# Patient Record
Sex: Female | Born: 1990 | Race: White | Hispanic: No | Marital: Single | Smoking: Never smoker
Health system: Southern US, Community
[De-identification: ages and names within clinical notes are randomized; demographics above are authoritative.]

## PROBLEM LIST (undated history)

## (undated) DIAGNOSIS — F988 Other specified behavioral and emotional disorders with onset usually occurring in childhood and adolescence: Secondary | ICD-10-CM

## (undated) HISTORY — DX: Other specified behavioral and emotional disorders with onset usually occurring in childhood and adolescence: F98.8

## (undated) HISTORY — PX: ADENOIDECTOMY: SUR15

---

## 2018-04-12 ENCOUNTER — Encounter (HOSPITAL_COMMUNITY): Payer: Self-pay | Admitting: Emergency Medicine

## 2018-04-12 ENCOUNTER — Ambulatory Visit (HOSPITAL_COMMUNITY)
Admission: EM | Admit: 2018-04-12 | Discharge: 2018-04-12 | Disposition: A | Discharge status: Home / Self Care | Payer: Self-pay | Attending: Emergency Medicine | Admitting: Emergency Medicine

## 2018-04-12 ENCOUNTER — Ambulatory Visit (INDEPENDENT_AMBULATORY_CARE_PROVIDER_SITE_OTHER): Payer: Self-pay

## 2018-04-12 DIAGNOSIS — M542 Cervicalgia: Secondary | ICD-10-CM

## 2018-04-12 DIAGNOSIS — M546 Pain in thoracic spine: Secondary | ICD-10-CM

## 2018-04-12 MED ORDER — CYCLOBENZAPRINE HCL 5 MG PO TABS: 5.0000 mg | ORAL_TABLET | Freq: Every evening | ORAL | 0 refills | Status: DC | PRN

## 2018-04-12 MED ORDER — MELOXICAM 7.5 MG PO TABS: 7.5000 mg | ORAL_TABLET | Freq: Every day | ORAL | 0 refills | Status: DC
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
## 2018-04-22 ENCOUNTER — Encounter: Payer: Self-pay | Admitting: Family Medicine

## 2018-04-22 ENCOUNTER — Ambulatory Visit: Payer: Commercial Managed Care - PPO | Admitting: Family Medicine

## 2018-04-22 VITALS — BP 120/70 | HR 95 | Ht 63.25 in | Wt 121.4 lb

## 2018-04-22 DIAGNOSIS — R61 Generalized hyperhidrosis: Secondary | ICD-10-CM | POA: Diagnosis not present

## 2018-04-22 DIAGNOSIS — F419 Anxiety disorder, unspecified: Secondary | ICD-10-CM | POA: Diagnosis not present

## 2018-04-22 DIAGNOSIS — R5383 Other fatigue: Secondary | ICD-10-CM

## 2018-04-22 DIAGNOSIS — R8290 Unspecified abnormal findings in urine: Secondary | ICD-10-CM | POA: Diagnosis not present

## 2018-04-22 DIAGNOSIS — G4700 Insomnia, unspecified: Secondary | ICD-10-CM | POA: Diagnosis not present

## 2018-04-22 DIAGNOSIS — Z862 Personal history of diseases of the blood and blood-forming organs and certain disorders involving the immune mechanism: Secondary | ICD-10-CM

## 2018-04-22 DIAGNOSIS — F988 Other specified behavioral and emotional disorders with onset usually occurring in childhood and adolescence: Secondary | ICD-10-CM | POA: Insufficient documentation

## 2018-04-22 DIAGNOSIS — N3001 Acute cystitis with hematuria: Secondary | ICD-10-CM

## 2018-04-22 DIAGNOSIS — Z8349 Family history of other endocrine, nutritional and metabolic diseases: Secondary | ICD-10-CM

## 2018-04-22 LAB — POCT URINALYSIS DIP (PROADVANTAGE DEVICE)
Bilirubin, UA: NEGATIVE
Glucose, UA: NEGATIVE mg/dL
Ketones, POC UA: NEGATIVE mg/dL
Leukocytes, UA: NEGATIVE
Nitrite, UA: POSITIVE — AB
PH UA: 6 (ref 5.0–8.0)
PROTEIN UA: NEGATIVE mg/dL
SPECIFIC GRAVITY, URINE: 1.03
UUROB: NEGATIVE

## 2018-04-22 MED ORDER — SULFAMETHOXAZOLE-TRIMETHOPRIM 800-160 MG PO TABS: 1.0000 | ORAL_TABLET | Freq: Two times a day (BID) | ORAL | 0 refills | Status: DC

## 2018-04-22 MED ORDER — LISDEXAMFETAMINE DIMESYLATE 20 MG PO CAPS: 20.0000 mg | ORAL_CAPSULE | Freq: Every day | ORAL | 0 refills | Status: DC

## 2018-04-22 NOTE — Progress Notes (Signed)
Subjective:    Patient ID: Nicole Vang, female    DOB: 02/26/1991, 27 y.o.   MRN: [id number]  HPI Chief Complaint  Patient presents with  . new pt    new pt get established. discuss ADD   She is new to the practice and here to establish care. Previous medical care: moved here from [place] one year ago.   Past medical history of ADD Sates she was dagnosed last year in [place].  She remembers as far back as 8th grade that she had issues focusing and this worsened in college.  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  States ADD is affecting her job and her personal life. She is single now.  States when she is not on the medication she feels extremely anxious. Denies history of anxiety but thinks she may have a problem with anxiety as well.   States she has been feeling increasingly irritable and on edge.  New issue with insomnia and night sweats.  Reports history of hair falling out "in clumps". No bald spots per patient.   States her periods are regular. Not sexually active and no chance of pregnancy.   Brother with thyroid disease and she is concerned that her thyroid may be abnormal.   Borderline anemia in the past.   She also complains of her urine having an "odor". States this has been the case for months. Thinks she could have a UTI. History of UTI with only bad odor. No frequency, urgency or dysuria.   Single. No kids.  Denies smoking, drug use. Alcohol is not often.   Denies fever, chills, dizziness, chest pain, palpitations, shortness of breath, cough, abdominal pain, nausea, vomiting.    Reviewed allergies, medications, past medical, surgical, family, and social history.    Review of Systems Pertinent positives and negatives in the history of present illness.     Objective:   Physical Exam BP 120/70   Pulse 95   Ht 5' 3.25" (1.607 m)   Wt 121 lb 6.4 oz (55.1 kg)   LMP 04/17/2018   BMI 21.34 kg/m    Alert and in no distress. Pharyngeal area is normal. Neck is supple without adenopathy or thyromegaly. Cardiac exam shows a regular sinus rhythm without murmurs or gallops. Lungs are clear to auscultation. No CVAT. Extremities without edema, normal pulses. PERRLA, EOMs intact, CN II-IX intact, DTRs symmetric. Skin is warm and dry, no pallor.       Assessment & Plan:  Attention deficit disorder (ADD) without hyperactivity - Plan: lisdexamfetamine (VYVANSE) 20 MG capsule  Fatigue, unspecified type - Plan: CBC with Differential/Platelet, Comprehensive metabolic panel, TSH, VITAMIN D 25 Hydroxy (Vit-D Deficiency, Fractures), Iron, TIBC and Ferritin Panel  Insomnia, unspecified type - Plan: TSH  Night sweats - Plan: TSH  Anxiety - Plan: CBC with Differential/Platelet, Comprehensive metabolic panel, TSH  History of anemia - Plan: CBC with Differential/Platelet, Iron, TIBC and Ferritin Panel  Malodorous urine - Plan: POCT Urinalysis DIP (Proadvantage Device)  Family history thyroid disease in brother - Plan: TSH  Acute cystitis with hematuria - Plan: sulfamethoxazole-trimethoprim (BACTRIM DS,SEPTRA DS) 800-160 MG tablet, Urine Culture, CANCELED: Urinalysis, microscopic only  She has her last prescription bottle of Vyvanse with her from [place]. I will give her a 30 day refill and get the documentation from her previous PCP.  She would like to see a therapist to discuss possible anxiety. A list of names was  given and she will schedule with someone.  Treat for acute cystitis. UA shows +nit, blood 2+ Urine sent for culture. She should have a repeat UA in a couple of weeks to make sure hematuria resolves.  Check labs including thyroid function and follow up.

## 2018-04-22 NOTE — Patient Instructions (Signed)
You can call to schedule your appointment with the Counselor. A few offices are listed below for you to call.   [facility]- Cristy FolksLauren Lewin Barry [address] [place], [place] [address] [phone]  [facility]  [address], [place], [place] [address]  Phone: [phone]  [facility] [address] [place], [place] [address]  Phone: [phone]

## 2018-04-23 LAB — COMPREHENSIVE METABOLIC PANEL
ALK PHOS: 70 IU/L (ref 39–117)
ALT: 13 IU/L (ref 0–32)
AST: 15 IU/L (ref 0–40)
Albumin/Globulin Ratio: 1.9 (ref 1.2–2.2)
Albumin: 4.8 g/dL (ref 3.5–5.5)
BILIRUBIN TOTAL: 0.3 mg/dL (ref 0.0–1.2)
BUN/Creatinine Ratio: 15 (ref 9–23)
BUN: 11 mg/dL (ref 6–20)
CHLORIDE: 106 mmol/L (ref 96–106)
CO2: 22 mmol/L (ref 20–29)
Calcium: 9.6 mg/dL (ref 8.7–10.2)
Creatinine, Ser: 0.73 mg/dL (ref 0.57–1.00)
GFR calc non Af Amer: 112 mL/min/{1.73_m2} (ref >59)
GFR, EST AFRICAN AMERICAN: 129 mL/min/{1.73_m2} (ref >59)
GLUCOSE: 80 mg/dL (ref 65–99)
Globulin, Total: 2.5 g/dL (ref 1.5–4.5)
Potassium: 4.4 mmol/L (ref 3.5–5.2)
Sodium: 143 mmol/L (ref 134–144)
TOTAL PROTEIN: 7.3 g/dL (ref 6.0–8.5)

## 2018-04-23 LAB — IRON,TIBC AND FERRITIN PANEL
Ferritin: 31 ng/mL (ref 15–150)
Iron Saturation: 19 % (ref 15–55)
Iron: 62 ug/dL (ref 27–159)
TIBC: 332 ug/dL (ref 250–450)
UIBC: 270 ug/dL (ref 131–425)

## 2018-04-23 LAB — CBC WITH DIFFERENTIAL/PLATELET
BASOS ABS: 0 10*3/uL (ref 0.0–0.2)
Basos: 0 %
EOS (ABSOLUTE): 0.1 10*3/uL (ref 0.0–0.4)
Eos: 1 %
Hematocrit: 38.3 % (ref 34.0–46.6)
Hemoglobin: 12.8 g/dL (ref 11.1–15.9)
IMMATURE GRANS (ABS): 0 10*3/uL (ref 0.0–0.1)
Immature Granulocytes: 0 %
LYMPHS: 47 %
Lymphocytes Absolute: 2.5 10*3/uL (ref 0.7–3.1)
MCH: 32 pg (ref 26.6–33.0)
MCHC: 33.4 g/dL (ref 31.5–35.7)
MCV: 96 fL (ref 79–97)
Monocytes Absolute: 0.3 10*3/uL (ref 0.1–0.9)
Monocytes: 5 %
NEUTROS ABS: 2.5 10*3/uL (ref 1.4–7.0)
Neutrophils: 47 %
PLATELETS: 272 10*3/uL (ref 150–450)
RBC: 4 x10E6/uL (ref 3.77–5.28)
RDW: 12.7 % (ref 12.3–15.4)
WBC: 5.4 10*3/uL (ref 3.4–10.8)

## 2018-04-23 LAB — VITAMIN D 25 HYDROXY (VIT D DEFICIENCY, FRACTURES): VIT D 25 HYDROXY: 20.1 ng/mL — AB (ref 30.0–100.0)

## 2018-04-23 LAB — TSH: TSH: 1.87 u[IU]/mL (ref 0.450–4.500)

## 2018-04-24 LAB — URINE CULTURE

## 2018-05-09 ENCOUNTER — Telehealth: Payer: Self-pay | Admitting: Family Medicine

## 2018-05-09 NOTE — Telephone Encounter (Signed)
Received requested info from [facility]. Sending back for review.

## 2018-05-16 ENCOUNTER — Encounter: Payer: Self-pay | Admitting: Family Medicine

## 2019-04-19 NOTE — Progress Notes (Deleted)
   Subjective:    Patient ID: Nicole Vang, female    DOB: 07/03/1991, 28 y.o.   MRN: [id number]  HPI No chief complaint on file.  She is here for a complete physical exam. Previous medical care: Last CPE:  Other providers:  Past medical history: Surgeries:  Family history: Mental Health History:  Social history: Lives with ***, works as ***, *** Smoking, drinking alcohol, drug use  Diet: *** Excerise: ***  Immunizations:  Health maintenance:  Mammogram: Colonoscopy: Last Gynecological Exam: Last Menstrual cycle: Pregnancies:  Last Dental Exam: Last Eye Exam:  Wears seatbelt always, uses sunscreen, smoke detectors in home and functioning, does not text while driving and feels safe in home environment.   Reviewed allergies, medications, past medical, surgical, family, and social history.     Review of Systems Review of Systems Constitutional: -fever, -chills, -sweats, -unexpected weight change,-fatigue ENT: -runny nose, -ear pain, -sore throat Cardiology:  -chest pain, -palpitations, -edema Respiratory: -cough, -shortness of breath, -wheezing Gastroenterology: -abdominal pain, -nausea, -vomiting, -diarrhea, -constipation  Hematology: -bleeding or bruising problems Musculoskeletal: -arthralgias, -myalgias, -joint swelling, -back pain Ophthalmology: -vision changes Urology: -dysuria, -difficulty urinating, -hematuria, -urinary frequency, -urgency Neurology: -headache, -weakness, -tingling, -numbness       Objective:   Physical Exam There were no vitals taken for this visit.  General Appearance:    Alert, cooperative, no distress, appears stated age  Head:    Normocephalic, without obvious abnormality, atraumatic  Eyes:    PERRL, conjunctiva/corneas clear, EOM's intact, fundi    benign  Ears:    Normal TM's and external ear canals  Nose:   Nares normal, mucosa normal, no drainage or sinus   tenderness  Throat:   Lips, mucosa, and tongue normal; teeth  and gums normal  Neck:   Supple, no lymphadenopathy;  thyroid:  no   enlargement/tenderness/nodules; no carotid   bruit or JVD  Back:    Spine nontender, no curvature, ROM normal, no CVA     tenderness  Lungs:     Clear to auscultation bilaterally without wheezes, rales or     ronchi; respirations unlabored  Chest Wall:    No tenderness or deformity   Heart:    Regular rate and rhythm, S1 and S2 normal, no murmur, rub   or gallop  Breast Exam:    No tenderness, masses, or nipple discharge or inversion.      No axillary lymphadenopathy  Abdomen:     Soft, non-tender, nondistended, normoactive bowel sounds,    no masses, no hepatosplenomegaly  Genitalia:    Normal external genitalia without lesions.  BUS and vagina normal; cervix without lesions, or cervical motion tenderness. No abnormal vaginal discharge.  Uterus and adnexa not enlarged, nontender, no masses.  Pap performed  Rectal:    Not performed due to age<40 and no related complaints  Extremities:   No clubbing, cyanosis or edema  Pulses:   2+ and symmetric all extremities  Skin:   Skin color, texture, turgor normal, no rashes or lesions  Lymph nodes:   Cervical, supraclavicular, and axillary nodes normal  Neurologic:   CNII-XII intact, normal strength, sensation and gait; reflexes 2+ and symmetric throughout          Psych:   Normal mood, affect, hygiene and grooming.           Assessment & Plan:  Routine general medical examination at a health care facility

## 2019-04-20 ENCOUNTER — Encounter: Payer: Commercial Managed Care - PPO | Admitting: Family Medicine
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
## 2020-02-19 ENCOUNTER — Ambulatory Visit: Payer: 59 | Attending: Internal Medicine

## 2021-04-03 ENCOUNTER — Ambulatory Visit: Payer: BLUE CROSS/BLUE SHIELD | Admitting: Internal Medicine

## 2022-02-06 DIAGNOSIS — M25519 Pain in unspecified shoulder: Secondary | ICD-10-CM | POA: Diagnosis not present

## 2022-03-16 DIAGNOSIS — M25511 Pain in right shoulder: Secondary | ICD-10-CM | POA: Diagnosis not present

## 2022-06-24 DIAGNOSIS — M25511 Pain in right shoulder: Secondary | ICD-10-CM | POA: Diagnosis not present

## 2022-07-15 DIAGNOSIS — F4321 Adjustment disorder with depressed mood: Secondary | ICD-10-CM | POA: Diagnosis not present

## 2022-07-22 DIAGNOSIS — F4321 Adjustment disorder with depressed mood: Secondary | ICD-10-CM | POA: Diagnosis not present

## 2022-07-29 DIAGNOSIS — F4321 Adjustment disorder with depressed mood: Secondary | ICD-10-CM | POA: Diagnosis not present

## 2022-08-20 DIAGNOSIS — Z6820 Body mass index (BMI) 20.0-20.9, adult: Secondary | ICD-10-CM | POA: Diagnosis not present

## 2022-08-20 DIAGNOSIS — Z01419 Encounter for gynecological examination (general) (routine) without abnormal findings: Secondary | ICD-10-CM | POA: Diagnosis not present

## 2022-09-02 DIAGNOSIS — F4321 Adjustment disorder with depressed mood: Secondary | ICD-10-CM | POA: Diagnosis not present

## 2022-09-23 DIAGNOSIS — F4321 Adjustment disorder with depressed mood: Secondary | ICD-10-CM | POA: Diagnosis not present

## 2022-10-28 DIAGNOSIS — F4321 Adjustment disorder with depressed mood: Secondary | ICD-10-CM | POA: Diagnosis not present

## 2023-08-09 ENCOUNTER — Ambulatory Visit: Payer: BC Managed Care – PPO | Admitting: Internal Medicine

## 2023-08-24 ENCOUNTER — Encounter: Payer: Self-pay | Admitting: Internal Medicine

## 2023-08-24 ENCOUNTER — Ambulatory Visit: Payer: BC Managed Care – PPO | Admitting: Internal Medicine

## 2023-08-24 VITALS — BP 120/80 | HR 99 | Temp 99.6°F | Ht 63.0 in | Wt 125.1 lb

## 2023-08-24 DIAGNOSIS — F900 Attention-deficit hyperactivity disorder, predominantly inattentive type: Secondary | ICD-10-CM | POA: Diagnosis not present

## 2023-08-24 MED ORDER — AMPHETAMINE-DEXTROAMPHET ER 20 MG PO CP24: 20.0000 mg | ORAL_CAPSULE | Freq: Every day | ORAL | 0 refills | Status: AC

## 2023-08-24 MED ORDER — AMPHETAMINE-DEXTROAMPHETAMINE 10 MG PO TABS: 10.0000 mg | ORAL_TABLET | Freq: Every day | ORAL | 0 refills | Status: AC

## 2023-08-24 NOTE — Progress Notes (Signed)
Established Patient Office Visit     CC/Reason for Visit: Establish care, medication refills, discuss chronic concerns  HPI: Nicole Vang is a 32 y.o. female who is coming in today for the above mentioned reasons. Past Medical History is significant for: ADHD on Adderall XR 20 mg and an additional 10 mg in the evening.  She is looking to establish care.  She does not smoke, she drinks alcohol occasionally, no known drug allergies, past surgical history is only significant for adenoidectomy.  She is due for Tdap, flu, COVID vaccinations.  She has [facility] G[facility].   Past Medical/Surgical History: Past Medical History:  Diagnosis Date   ADD (attention deficit disorder)     Past Surgical History:  Procedure Laterality Date   ADENOIDECTOMY      Social History:  reports that she has never smoked. She has never used smokeless tobacco. She reports current alcohol use. She reports that she does not use drugs.  Allergies: No Known Allergies  Family History:  Family History  Problem Relation Age of Onset   Fibromyalgia Mother    Bipolar disorder Mother    Thyroid disease Brother    Asthma Brother      Current Outpatient Medications:    amphetamine-dextroamphetamine (ADDERALL XR) 20 MG 24 hr capsule, Take 1 capsule (20 mg total) by mouth daily., Disp: 30 capsule, Rfl: 0   amphetamine-dextroamphetamine (ADDERALL XR) 20 MG 24 hr capsule, Take 1 capsule (20 mg total) by mouth daily., Disp: 30 capsule, Rfl: 0   amphetamine-dextroamphetamine (ADDERALL XR) 20 MG 24 hr capsule, Take 1 capsule (20 mg total) by mouth daily., Disp: 30 capsule, Rfl: 0   amphetamine-dextroamphetamine (ADDERALL) 10 MG tablet, Take 1 tablet (10 mg total) by mouth daily., Disp: 30 tablet, Rfl: 0   amphetamine-dextroamphetamine (ADDERALL) 10 MG tablet, Take 1 tablet (10 mg total) by mouth daily., Disp: 30 tablet, Rfl: 0   amphetamine-dextroamphetamine (ADDERALL) 10 MG tablet, Take 1 tablet  (10 mg total) by mouth daily., Disp: 30 tablet, Rfl: 0  Review of Systems:  Negative unless indicated in HPI.   Physical Exam: Vitals:   08/24/23 1125  BP: 120/80  Pulse: 99  Temp: 99.6 F (37.6 C)  TempSrc: Oral  SpO2: 100%  Weight: 125 lb 1.6 oz (56.7 kg)  Height: 5\' 3"  (1.6 m)    Body mass index is 22.16 kg/m.   Physical Exam Vitals reviewed.  Constitutional:      Appearance: Normal appearance.  HENT:     Head: Normocephalic and atraumatic.  Eyes:     Conjunctiva/sclera: Conjunctivae normal.     Pupils: Pupils are equal, round, and reactive to light.  Cardiovascular:     Rate and Rhythm: Normal rate and regular rhythm.  Pulmonary:     Effort: Pulmonary effort is normal.     Breath sounds: Normal breath sounds.  Skin:    General: Skin is warm and dry.  Neurological:     General: No focal deficit present.     Mental Status: She is alert and oriented to person, place, and time.  Psychiatric:        Mood and Affect: Mood normal.        Behavior: Behavior normal.        Thought Content: Thought content normal.        Judgment: Judgment normal.      Impression and Plan:  Attention deficit hyperactivity disorder (ADHD), predominantly inattentive type -  Amphetamine-Dextroamphet ER; Take 1 capsule (20 mg total) by mouth daily.  Dispense: 30 capsule; Refill: 0 -     Amphetamine-Dextroamphet ER; Take 1 capsule (20 mg total) by mouth daily.  Dispense: 30 capsule; Refill: 0 -     Amphetamine-Dextroamphet ER; Take 1 capsule (20 mg total) by mouth daily.  Dispense: 30 capsule; Refill: 0 -     Amphetamine-Dextroamphetamine; Take 1 tablet (10 mg total) by mouth daily.  Dispense: 30 tablet; Refill: 0 -     Amphetamine-Dextroamphetamine; Take 1 tablet (10 mg total) by mouth daily.  Dispense: 30 tablet; Refill: 0 -     Amphetamine-Dextroamphetamine; Take 1 tablet (10 mg total) by mouth daily.  Dispense: 30 tablet; Refill: 0  -PDMP reviewed, no red flags, overdose risk  score is 270. -Refill Adderall XR 20 mg to take 1 tablet daily for total of 30 tablets a month x 3 months. -Refill Adderall 10 mg to take 1 tablet in the afternoon for total of 30 tablets a month x 3 months.  Next  -She declines flu and Tdap vaccinations today.  Will consider updating at a later date.  She will schedule follow-up visit for CPE.   Time spent:31 minutes reviewing chart, interviewing and examining patient and formulating plan of care.     Chaya Jan, MD [facility]

## 2023-09-09 DIAGNOSIS — Z6821 Body mass index (BMI) 21.0-21.9, adult: Secondary | ICD-10-CM | POA: Diagnosis not present

## 2023-09-09 DIAGNOSIS — Z01419 Encounter for gynecological examination (general) (routine) without abnormal findings: Secondary | ICD-10-CM | POA: Diagnosis not present

## 2023-10-11 ENCOUNTER — Encounter: Payer: BC Managed Care – PPO | Admitting: Internal Medicine
